# Patient Record
Sex: Female | Born: 1990 | Race: White | Hispanic: No | Marital: Married | Smoking: Never smoker
Health system: Southern US, Community
[De-identification: ages and names within clinical notes are randomized; demographics above are authoritative.]

## PROBLEM LIST (undated history)

## (undated) DIAGNOSIS — Z789 Other specified health status: Secondary | ICD-10-CM

## (undated) HISTORY — DX: Other specified health status: Z78.9

## (undated) HISTORY — PX: NO PAST SURGERIES: SHX2092

---

## 2019-12-15 NOTE — L&D Delivery Note (Signed)
Delivery Note At 5:07 PM a viable female was delivered via Vaginal, Spontaneous (Presentation:   Occiput Anterior).  APGAR: 9, 9; weight pending.   Placenta status: Spontaneous, Intact. 3V Cord with the following complications:  none.  Cord pH: n/a  Anesthesia: Local Episiotomy: None Lacerations: None Suture Repair: n/a Est. Blood Loss (mL): 250  Mom to postpartum.  Baby to Couplet care / Skin to Skin.  Mitchel Honour 10/24/2020, 5:41 PM

## 2020-03-22 LAB — OB RESULTS CONSOLE HEPATITIS B SURFACE ANTIGEN: Hepatitis B Surface Ag: NEGATIVE

## 2020-03-22 LAB — OB RESULTS CONSOLE HIV ANTIBODY (ROUTINE TESTING): HIV: NONREACTIVE

## 2020-03-22 LAB — OB RESULTS CONSOLE RUBELLA ANTIBODY, IGM: Rubella: IMMUNE

## 2020-03-22 LAB — OB RESULTS CONSOLE GC/CHLAMYDIA
Chlamydia: NEGATIVE
Gonorrhea: NEGATIVE

## 2020-03-27 ENCOUNTER — Inpatient Hospital Stay (HOSPITAL_COMMUNITY)
Admission: AD | Admit: 2020-03-27 | Payer: BC Managed Care – PPO | Source: Home / Self Care | Admitting: Obstetrics and Gynecology

## 2020-07-25 LAB — OB RESULTS CONSOLE HIV ANTIBODY (ROUTINE TESTING): HIV: NONREACTIVE

## 2020-10-16 ENCOUNTER — Telehealth (HOSPITAL_COMMUNITY): Payer: Self-pay | Admitting: *Deleted

## 2020-10-16 ENCOUNTER — Encounter (HOSPITAL_COMMUNITY): Payer: Self-pay | Admitting: *Deleted

## 2020-10-16 NOTE — Telephone Encounter (Signed)
Preadmission screen  

## 2020-10-18 ENCOUNTER — Telehealth (HOSPITAL_COMMUNITY): Payer: Self-pay | Admitting: *Deleted

## 2020-10-18 NOTE — Telephone Encounter (Signed)
Preadmission screen  

## 2020-10-21 ENCOUNTER — Encounter (HOSPITAL_COMMUNITY): Payer: Self-pay | Admitting: *Deleted

## 2020-10-21 ENCOUNTER — Telehealth (HOSPITAL_COMMUNITY): Payer: Self-pay | Admitting: *Deleted

## 2020-10-21 NOTE — Telephone Encounter (Signed)
Preadmission screen  

## 2020-10-22 ENCOUNTER — Other Ambulatory Visit (HOSPITAL_COMMUNITY)
Admission: RE | Admit: 2020-10-22 | Discharge: 2020-10-22 | Disposition: A | Discharge status: Home / Self Care | Payer: BC Managed Care – PPO | Source: Ambulatory Visit | Attending: Obstetrics & Gynecology | Admitting: Obstetrics & Gynecology

## 2020-10-22 DIAGNOSIS — Z20822 Contact with and (suspected) exposure to covid-19: Secondary | ICD-10-CM | POA: Insufficient documentation

## 2020-10-22 LAB — SARS CORONAVIRUS 2 (TAT 6-24 HRS): SARS Coronavirus 2: NEGATIVE

## 2020-10-24 ENCOUNTER — Inpatient Hospital Stay (HOSPITAL_COMMUNITY): Payer: BC Managed Care – PPO

## 2020-10-24 ENCOUNTER — Encounter (HOSPITAL_COMMUNITY): Payer: Self-pay | Admitting: Obstetrics & Gynecology

## 2020-10-24 ENCOUNTER — Inpatient Hospital Stay (HOSPITAL_COMMUNITY)
Admission: AD | Admit: 2020-10-24 | Discharge: 2020-10-26 | DRG: 806 | Disposition: A | Discharge status: Home / Self Care | Payer: BC Managed Care – PPO | Attending: Obstetrics & Gynecology | Admitting: Obstetrics & Gynecology

## 2020-10-24 DIAGNOSIS — Z20822 Contact with and (suspected) exposure to covid-19: Secondary | ICD-10-CM | POA: Diagnosis present

## 2020-10-24 DIAGNOSIS — Z3490 Encounter for supervision of normal pregnancy, unspecified, unspecified trimester: Secondary | ICD-10-CM

## 2020-10-24 DIAGNOSIS — D62 Acute posthemorrhagic anemia: Secondary | ICD-10-CM | POA: Diagnosis not present

## 2020-10-24 DIAGNOSIS — Z3A39 39 weeks gestation of pregnancy: Secondary | ICD-10-CM

## 2020-10-24 DIAGNOSIS — O9081 Anemia of the puerperium: Principal | ICD-10-CM | POA: Diagnosis not present

## 2020-10-24 DIAGNOSIS — R58 Hemorrhage, not elsewhere classified: Secondary | ICD-10-CM

## 2020-10-24 DIAGNOSIS — O26893 Other specified pregnancy related conditions, third trimester: Secondary | ICD-10-CM | POA: Diagnosis present

## 2020-10-24 LAB — CBC
HCT: 29.9 % — ABNORMAL LOW (ref 36.0–46.0)
Hemoglobin: 9.3 g/dL — ABNORMAL LOW (ref 12.0–15.0)
MCH: 24.8 pg — ABNORMAL LOW (ref 26.0–34.0)
MCHC: 31.1 g/dL (ref 30.0–36.0)
MCV: 79.7 fL — ABNORMAL LOW (ref 80.0–100.0)
Platelets: 206 10*3/uL (ref 150–400)
RBC: 3.75 MIL/uL — ABNORMAL LOW (ref 3.87–5.11)
RDW: 13 % (ref 11.5–15.5)
WBC: 9.6 10*3/uL (ref 4.0–10.5)
nRBC: 0 % (ref 0.0–0.2)

## 2020-10-24 MED ORDER — LACTATED RINGERS IV SOLN: INTRAVENOUS | Status: DC

## 2020-10-24 MED ORDER — COCONUT OIL OIL: 1.0000 "application " | TOPICAL_OIL | Status: DC | PRN

## 2020-10-24 MED ORDER — BENZOCAINE-MENTHOL 20-0.5 % EX AERO: 1.0000 "application " | INHALATION_SPRAY | CUTANEOUS | Status: DC | PRN

## 2020-10-24 MED ORDER — TETANUS-DIPHTH-ACELL PERTUSSIS 5-2.5-18.5 LF-MCG/0.5 IM SUSY: 0.5000 mL | PREFILLED_SYRINGE | Freq: Once | INTRAMUSCULAR | Status: DC

## 2020-10-24 MED ORDER — OXYTOCIN BOLUS FROM INFUSION
333.0000 mL | Freq: Once | INTRAVENOUS | Status: AC
  Administered 2020-10-24: 333 mL via INTRAVENOUS

## 2020-10-24 MED ORDER — LACTATED RINGERS IV BOLUS
500.0000 mL | Freq: Once | INTRAVENOUS | Status: AC
  Administered 2020-10-25: 500 mL via INTRAVENOUS

## 2020-10-24 MED ORDER — ONDANSETRON HCL 4 MG PO TABS: 4.0000 mg | ORAL_TABLET | ORAL | Status: DC | PRN

## 2020-10-24 MED ORDER — ONDANSETRON HCL 4 MG/2ML IJ SOLN: 4.0000 mg | Freq: Four times a day (QID) | INTRAMUSCULAR | Status: DC | PRN

## 2020-10-24 MED ORDER — METHYLERGONOVINE MALEATE 0.2 MG/ML IJ SOLN
0.2000 mg | INTRAMUSCULAR | Status: AC
  Administered 2020-10-24: 0.2 mg via INTRAMUSCULAR
  Filled 2020-10-24: qty 1

## 2020-10-24 MED ORDER — IBUPROFEN 600 MG PO TABS
600.0000 mg | ORAL_TABLET | Freq: Four times a day (QID) | ORAL | Status: DC
  Administered 2020-10-24 – 2020-10-26 (×6): 600 mg via ORAL
  Filled 2020-10-24 (×7): qty 1

## 2020-10-24 MED ORDER — LACTATED RINGERS IV SOLN: 500.0000 mL | INTRAVENOUS | Status: DC | PRN

## 2020-10-24 MED ORDER — DIPHENHYDRAMINE HCL 25 MG PO CAPS: 25.0000 mg | ORAL_CAPSULE | Freq: Four times a day (QID) | ORAL | Status: DC | PRN

## 2020-10-24 MED ORDER — ONDANSETRON HCL 4 MG/2ML IJ SOLN: 4.0000 mg | INTRAMUSCULAR | Status: DC | PRN

## 2020-10-24 MED ORDER — ACETAMINOPHEN 325 MG PO TABS
650.0000 mg | ORAL_TABLET | ORAL | Status: DC | PRN
  Administered 2020-10-25 – 2020-10-26 (×2): 650 mg via ORAL
  Filled 2020-10-24: qty 2

## 2020-10-24 MED ORDER — OXYTOCIN-SODIUM CHLORIDE 30-0.9 UT/500ML-% IV SOLN
1.0000 m[IU]/min | INTRAVENOUS | Status: DC
  Administered 2020-10-24: 2 m[IU]/min via INTRAVENOUS

## 2020-10-24 MED ORDER — TERBUTALINE SULFATE 1 MG/ML IJ SOLN: 0.2500 mg | Freq: Once | INTRAMUSCULAR | Status: DC | PRN

## 2020-10-24 MED ORDER — OXYCODONE-ACETAMINOPHEN 5-325 MG PO TABS: 2.0000 | ORAL_TABLET | ORAL | Status: DC | PRN

## 2020-10-24 MED ORDER — LIDOCAINE HCL (PF) 1 % IJ SOLN
30.0000 mL | INTRAMUSCULAR | Status: AC | PRN
  Administered 2020-10-24: 30 mL via SUBCUTANEOUS
  Filled 2020-10-24: qty 30

## 2020-10-24 MED ORDER — SENNOSIDES-DOCUSATE SODIUM 8.6-50 MG PO TABS
2.0000 | ORAL_TABLET | ORAL | Status: DC
  Administered 2020-10-25: 2 via ORAL
  Filled 2020-10-24 (×2): qty 2

## 2020-10-24 MED ORDER — ACETAMINOPHEN 325 MG PO TABS: 650.0000 mg | ORAL_TABLET | ORAL | Status: DC | PRN

## 2020-10-24 MED ORDER — OXYCODONE-ACETAMINOPHEN 5-325 MG PO TABS: 1.0000 | ORAL_TABLET | ORAL | Status: DC | PRN

## 2020-10-24 MED ORDER — DIBUCAINE (PERIANAL) 1 % EX OINT: 1.0000 "application " | TOPICAL_OINTMENT | CUTANEOUS | Status: DC | PRN

## 2020-10-24 MED ORDER — SIMETHICONE 80 MG PO CHEW: 80.0000 mg | CHEWABLE_TABLET | ORAL | Status: DC | PRN

## 2020-10-24 MED ORDER — OXYCODONE-ACETAMINOPHEN 5-325 MG PO TABS
2.0000 | ORAL_TABLET | ORAL | Status: DC | PRN
  Administered 2020-10-25: 2 via ORAL
  Filled 2020-10-24: qty 2

## 2020-10-24 MED ORDER — MISOPROSTOL 25 MCG QUARTER TABLET: 25.0000 ug | ORAL_TABLET | ORAL | Status: DC | PRN

## 2020-10-24 MED ORDER — OXYTOCIN-SODIUM CHLORIDE 30-0.9 UT/500ML-% IV SOLN
2.5000 [IU]/h | INTRAVENOUS | Status: DC
  Filled 2020-10-24: qty 500

## 2020-10-24 MED ORDER — PRENATAL MULTIVITAMIN CH
1.0000 | ORAL_TABLET | Freq: Every day | ORAL | Status: DC
  Administered 2020-10-25: 1 via ORAL
  Filled 2020-10-24 (×2): qty 1

## 2020-10-24 MED ORDER — FENTANYL CITRATE (PF) 100 MCG/2ML IJ SOLN: 50.0000 ug | INTRAMUSCULAR | Status: DC | PRN

## 2020-10-24 MED ORDER — SOD CITRATE-CITRIC ACID 500-334 MG/5ML PO SOLN: 30.0000 mL | ORAL | Status: DC | PRN

## 2020-10-24 MED ORDER — ZOLPIDEM TARTRATE 5 MG PO TABS: 5.0000 mg | ORAL_TABLET | Freq: Every evening | ORAL | Status: DC | PRN

## 2020-10-24 MED ORDER — TRANEXAMIC ACID-NACL 1000-0.7 MG/100ML-% IV SOLN
1000.0000 mg | Freq: Once | INTRAVENOUS | Status: AC
  Administered 2020-10-24: 1000 mg via INTRAVENOUS
  Filled 2020-10-24: qty 100

## 2020-10-24 MED ORDER — WITCH HAZEL-GLYCERIN EX PADS: 1.0000 "application " | MEDICATED_PAD | CUTANEOUS | Status: DC | PRN

## 2020-10-24 NOTE — Progress Notes (Signed)
On admission pt had moderate bleeding with small clots in pad. Pt had slight trickling with fundal massage. Dr. Langston Masker notified by Benedict Needy L&D RN and requested at bed side. Dr. Langston Masker ordered methergine 0.2mg  IM and came to pt bed side. Pt assessed by Dr. Langston Masker. Methergine 0.2 mg IM given per order. Pt stable, vital signs within normal limits. Pt bleeding now small, no clots. Fundus firm, one below.  No new orders given. Will continue to monitor pt.

## 2020-10-24 NOTE — Progress Notes (Signed)
This RN entered room to do pts one hour check. Pt. In bathroom, voiding. Plum sized clot on pts pad in addition to a moderate amount of bleeding. Assisted pt to bed and performed fundal massage. Trickles of blood during and after massage noted. No additional clots. Notified MD whom is en route to bedside.

## 2020-10-24 NOTE — Progress Notes (Signed)
I was called earlier this evening immediately after patient's arrival to the floor for increased bleeding and clots; measured to be approximately 250 cc.  Patient received IM methergine.  Excellent uterine tone and no further bleeding on my exam.  I was called again about 20 minutes ago and informed that when patient was up to the BR, she passed plum sized clot.  On exam, fundal massage evacuated another clot (total of 250 from this episode, 500cc since arrival to floor).  Vital signs are stable and uterus is firm 2 below umbilicus.  Will order TXA and continue to closely monitor.  Mitchel Honour, DO

## 2020-10-24 NOTE — H&P (Signed)
Evetta Semple is a 29 y.o. female G2P1001 at [redacted]w[redacted]d presenting for elective IOL.  Antepartum course uncomplicated.  Patient has h/o LEEP.  GBS negative.  OB History    Gravida  1   Para      Term      Preterm      AB      Living        SAB      TAB      Ectopic      Multiple      Live Births             Past Medical History:  Diagnosis Date  . Medical history non-contributory        Maternal Diabetes: No Genetic Screening: Normal Maternal Ultrasounds/Referrals: Normal Fetal Ultrasounds or other Referrals:  None Maternal Substance Abuse:  No Significant Maternal Medications:  None Significant Maternal Lab Results:  Group B Strep negative Other Comments:  None  Review of Systems Maternal Medical History:  Fetal activity: Perceived fetal activity is normal.   Last perceived fetal movement was within the past hour.    Prenatal complications: no prenatal complications Prenatal Complications - Diabetes: none.      There were no vitals taken for this visit. Maternal Exam:  Uterine Assessment: Contraction strength is mild.  Contraction frequency is rare.   Abdomen: Patient reports no abdominal tenderness. Fundal height is c/w dates.   Estimated fetal weight is 7#8.   Fetal presentation: vertex  Introitus: Normal vulva. Pelvis: adequate for delivery.   Cervix: Cervix evaluated by digital exam.     Fetal Exam Fetal Monitor Review: Baseline rate: 150.  Variability: moderate (6-25 bpm).   Pattern: accelerations present and no decelerations.    Fetal State Assessment: Category I - tracings are normal.     Physical Exam Constitutional:      Appearance: Normal appearance.  HENT:     Head: Normocephalic and atraumatic.  Pulmonary:     Effort: Pulmonary effort is normal.  Abdominal:     Palpations: Abdomen is soft.  Genitourinary:    General: Normal vulva.  Musculoskeletal:        General: Normal range of motion.     Cervical back: Normal range  of motion.  Skin:    General: Skin is warm and dry.  Neurological:     Mental Status: She is alert and oriented to person, place, and time.  Psychiatric:        Mood and Affect: Mood normal.        Behavior: Behavior normal.     Prenatal labs: ABO, Rh:  A positive Antibody:  Negative Rubella:  Immune RPR:   NR HBsAg:   Negative HIV:   NR GBS:   Negative  Assessment/Plan: 29yo G2P1001 at [redacted]w[redacted]d for elective IOL -Start pitocin -AROM when able -CLEA if desired -Anticipate NSVD   Mitchel Honour 10/24/2020, 1:28 PM

## 2020-10-25 LAB — PREPARE RBC (CROSSMATCH)

## 2020-10-25 LAB — CBC
HCT: 29.1 % — ABNORMAL LOW (ref 36.0–46.0)
Hemoglobin: 9.5 g/dL — ABNORMAL LOW (ref 12.0–15.0)
MCH: 27.5 pg (ref 26.0–34.0)
MCHC: 32.6 g/dL (ref 30.0–36.0)
MCV: 84.1 fL (ref 80.0–100.0)
Platelets: 170 10*3/uL (ref 150–400)
RBC: 3.46 MIL/uL — ABNORMAL LOW (ref 3.87–5.11)
RDW: 15 % (ref 11.5–15.5)
WBC: 14.5 10*3/uL — ABNORMAL HIGH (ref 4.0–10.5)
nRBC: 0 % (ref 0.0–0.2)

## 2020-10-25 LAB — RPR: RPR Ser Ql: NONREACTIVE

## 2020-10-25 LAB — ABO/RH: ABO/RH(D): A POS

## 2020-10-25 MED ORDER — ACETAMINOPHEN 325 MG PO TABS
650.0000 mg | ORAL_TABLET | Freq: Once | ORAL | Status: DC
  Filled 2020-10-25: qty 2

## 2020-10-25 MED ORDER — MISOPROSTOL 200 MCG PO TABS
ORAL_TABLET | ORAL | Status: AC
  Filled 2020-10-25: qty 5

## 2020-10-25 MED ORDER — SODIUM CHLORIDE 0.9% IV SOLUTION: Freq: Once | INTRAVENOUS | Status: AC

## 2020-10-25 MED ORDER — DIPHENHYDRAMINE HCL 25 MG PO CAPS
25.0000 mg | ORAL_CAPSULE | Freq: Once | ORAL | Status: AC
  Administered 2020-10-25: 25 mg via ORAL
  Filled 2020-10-25: qty 1

## 2020-10-25 MED ORDER — CARBOPROST TROMETHAMINE 250 MCG/ML IM SOLN
INTRAMUSCULAR | Status: AC
  Filled 2020-10-25: qty 1

## 2020-10-25 MED ORDER — DIPHENOXYLATE-ATROPINE 2.5-0.025 MG PO TABS
1.0000 | ORAL_TABLET | Freq: Once | ORAL | Status: AC
  Administered 2020-10-25: 1 via ORAL
  Filled 2020-10-25: qty 1

## 2020-10-25 NOTE — Progress Notes (Signed)
Patient reports feeling improved after 1u PRBCs in.  She still reports fatigue.  Patient is sitting up and nursing on my assessment.   Vital signs remain stable.  Fundus continues to be firm and moderate amount of blood on peri-pad; no blood/clots expressed on massage. Vaginal bleeding has improved but if worsens, would consider repeating u/s per radiologist's read:  IMPRESSION: 1. Enlarged uterus consistent with recent delivery. 2. Significant endometrial thickening without increased vascularity. Findings are nonspecific and could indicate endometrial blood products although hypovascular retained products of conception cannot be fully excluded. Consider short-term follow-up pelvic Ultrasound.  Plan to continue close monitoring.  Hemoglobin ordered 4 h post-transfusion.  Mitchel Honour, DO

## 2020-10-25 NOTE — Progress Notes (Signed)
PPD # 1  NSVD PPH - requiring meds and 2 units PRBCs  S:  Second unit of blood transfusion just completed.  Feeling much better. Got up to go to bathroom and minimal clotting noted.  O:  BP 101/64    Pulse 61    Temp 98.4 F (36.9 C) (Oral)    Resp 16    Ht 5\' 5"  (1.651 m)    Wt 72.3 kg    SpO2 98%    Breastfeeding Unknown    BMI 26.53 kg/m  Results for orders placed or performed during the hospital encounter of 10/24/20 (from the past 24 hour(s))  CBC     Status: Abnormal   Collection Time: 10/24/20 12:55 PM  Result Value Ref Range   WBC 9.6 4.0 - 10.5 K/uL   RBC 3.75 (L) 3.87 - 5.11 MIL/uL   Hemoglobin 9.3 (L) 12.0 - 15.0 g/dL   HCT 13/11/21 (L) 36 - 46 %   MCV 79.7 (L) 80.0 - 100.0 fL   MCH 24.8 (L) 26.0 - 34.0 pg   MCHC 31.1 30.0 - 36.0 g/dL   RDW 28.4 13.2 - 44.0 %   Platelets 206 150 - 400 K/uL   nRBC 0.0 0.0 - 0.2 %  Type and screen [facility]     Status: None (Preliminary result)   Collection Time: 10/24/20 12:55 PM  Result Value Ref Range   ABO/RH(D) A POS    Antibody Screen NEG    Sample Expiration 10/27/2020,2359    Unit Number 10/29/2020    Blood Component Type RED CELLS,LR    Unit division 00    Status of Unit ISSUED    Transfusion Status OK TO TRANSFUSE    Crossmatch Result      Compatible Performed at [facility], [address], [place], [place]    Unit Number [id number]    Blood Component Type RED CELLS,LR    Unit division 00    Status of Unit ISSUED    Transfusion Status OK TO TRANSFUSE    Crossmatch Result Compatible   Prepare RBC (crossmatch)     Status: None   Collection Time: 10/25/20  1:37 AM  Result Value Ref Range   Order Confirmation      ORDER PROCESSED BY BLOOD BANK Performed at [facility], [address], [place], [place]   ABO/Rh     Status: None   Collection Time: 10/25/20  1:53 AM  Result Value Ref Range   ABO/RH(D)      A POS Performed at [facility], [address] [place], [place]    Abdomen is soft and non tender Lochia WNL  IMPRESSION: PPD # 1  NSVD  PLAN: Doing well Repeat CBC at 1230 today  Monitor vaginal bleeding

## 2020-10-25 NOTE — Lactation Note (Signed)
This note was copied from a baby's chart. Lactation Consultation Note  Patient Name: Misty Neal'P Date: 10/25/2020  P2, 25 hour term female infant. LC entered room, mom was hold sleeping infant in her arms, infant swaddled in clothing. Per mom, she has not been BF infant, STS today. Per mom, she has been taking infant off breast and re-latching infant when she hears a clicking nose, LC praised mom on re-latching infant when this happens. Mom feels BF is going well, she doesn't have any concerns, infant has been breastfeeding 15-20 minutes most feedings. Mom is experienced with BF, she BF her 24 year old for one month. Mom has DEBP at home. Mom understands to BF infant by cues, 8 to 12+ times within 24 hours and will start BF infant STS while in hospital. Mom knows to call RN or LC if she needs any assistance with latching infant at the breast. LC suggested mom attend [facility] BF support group after hospital discharge. Mom made aware of O/P services, breastfeeding support groups, community resources, and our phone # for post-discharge questions.    Maternal Data    Feeding    LATCH Score                   Interventions    Lactation Tools Discussed/Used     Consult Status      Danelle Earthly 10/25/2020, 6:06 PM

## 2020-10-25 NOTE — Progress Notes (Signed)
Patient continues to have passage of small clots periodically.  U/S was ordered to r/o retained POC and showed normal postpartum uterus.  1000 mcg cytotec placed and hemabate/lomotil given.  At this time, EBL since arrival to Mother/Baby 1250cc. EBL with delivery was 250cc for total blood loss 1500cc.  Patient's starting hemoglobin prior to delivery 9.3.  Given acute blood loss on chronic anemia, I recommend transfusion of 2 u PRBCs.  Patient is counseled re: risk of viral transmission and transfusion reaction.  All questions were answered and she wishes to proceed.  2nd IV to be placed.

## 2020-10-25 NOTE — Progress Notes (Addendum)
Pt bleeding and clots continue. MD called to bedside and order for pelvic u/s and 2 units PRBC's ordered. Refer to flowsheets.

## 2020-10-26 LAB — BPAM RBC
Blood Product Expiration Date: 202111272359
Blood Product Expiration Date: 202112032359
ISSUE DATE / TIME: 202111120205
ISSUE DATE / TIME: 202111120205
Unit Type and Rh: 6200
Unit Type and Rh: 6200

## 2020-10-26 LAB — TYPE AND SCREEN
ABO/RH(D): A POS
Antibody Screen: NEGATIVE
Unit division: 0
Unit division: 0

## 2020-10-26 MED ORDER — FE FUMARATE-B12-VIT C-FA-IFC PO CAPS
1.0000 | ORAL_CAPSULE | Freq: Two times a day (BID) | ORAL | Status: DC
  Administered 2020-10-26: 1 via ORAL
  Filled 2020-10-26 (×2): qty 1

## 2020-10-26 NOTE — Discharge Summary (Signed)
Postpartum Discharge Summary  Date of Service October 26, 2020     Patient Name: Misty Neal DOB: 06/16/1991 MRN: [id number]  Date of admission: 10/24/2020 Delivery date:10/24/2020  Delivering provider: Linda Hedges  Date of discharge: 10/26/2020  Admitting diagnosis: Pregnancy [Z34.90] Intrauterine pregnancy: [redacted]w[redacted]d    Secondary diagnosis:  Active Problems:   Pregnancy  Additional problems: [place]  Discharge diagnosis: Term Pregnancy Delivered                                              Post partum procedures:blood transfusion Augmentation: AROM, Pitocin and Cytotec Complications: [id number] Hospital course: Induction of Labor With Vaginal Delivery   29y.o. yo G2P1001 at 318w3das admitted to the hospital 10/24/2020 for induction of labor.  Indication for induction: Favorable cervix at term.  Patient had an uncomplicated labor course as follows: Membrane Rupture Time/Date: 1:35 PM ,10/24/2020   Delivery Method:Vaginal, Spontaneous  Episiotomy: None  Lacerations:  None  Details of delivery can be found in separate delivery note.  Patient had a routine postpartum course. Patient is discharged home 10/26/20.  Newborn Data: Birth date:10/24/2020  Birth time:5:07 PM  Gender:Female  Living status:Living  Apgars:9 ,9  Weight:3430 g   Magnesium Sulfate received: No BMZ received: No Rhophylac:No MMR:No T-DaP:Given prenatally Flu: Yes Transfusion:Yes  Physical exam  Vitals:   10/25/20 0835 10/25/20 1630 10/25/20 2151 10/26/20 0530  BP: 101/64 98/73 94/60  98/62  Pulse: 61 70 67 65  Resp: 16 18 18 18   Temp: 98.4 F (36.9 C) 98 F (36.7 C) 98 F (36.7 C) 98 F (36.7 C)  TempSrc: Oral Oral Oral Oral  SpO2: 98%  100% 100%  Weight:      Height:       General: alert Lochia: appropriate Uterine Fundus: firm Incision: Healing well with no significant drainage DVT Evaluation: No evidence of DVT seen on physical exam. Labs: Lab Results  Component  Value Date   WBC 14.5 (H) 10/25/2020   HGB 9.5 (L) 10/25/2020   HCT 29.1 (L) 10/25/2020   MCV 84.1 10/25/2020   PLT 170 10/25/2020   No flowsheet data found. Edinburgh Score: Edinburgh Postnatal Depression Scale Screening Tool 10/25/2020  I have been able to laugh and see the funny side of things. 0  I have looked forward with enjoyment to things. 0  I have blamed myself unnecessarily when things went wrong. 1  I have been anxious or worried for no good reason. 2  I have felt scared or panicky for no good reason. 1  Things have been getting on top of me. 0  I have been so unhappy that I have had difficulty sleeping. 0  I have felt sad or miserable. 1  I have been so unhappy that I have been crying. 0  The thought of harming myself has occurred to me. 0  Edinburgh Postnatal Depression Scale Total 5      After visit meds:  Allergies as of 10/26/2020   No Known Allergies     Medication List    You have not been prescribed any medications.      Discharge home in stable condition Infant Feeding: Breast Infant Disposition:home with mother Discharge instruction: per After Visit Summary and Postpartum booklet. Activity: Advance as tolerated. Pelvic rest for 6 weeks.  Diet: routine diet Anticipated Birth Control: Unsure  Postpartum Appointment:6 weeks Additional Postpartum F/U: none Future Appointments:No future appointments. Follow up Visit:      10/26/2020 Cyril Mourning, MD

## 2020-10-26 NOTE — Progress Notes (Signed)
AVS printed and discharged instructions given to patient. Patient instructed to call for follow-up appointment. All questions answered and pt verbalized understanding.  

## 2020-10-26 NOTE — Lactation Note (Signed)
This note was copied from a baby's chart. Lactation Consultation Note  Patient Name: Girl Jamelah Sitzer IRCVE'L Date: 10/26/2020 Reason for consult: Follow-up assessment;Infant weight loss;Term  Baby is 56 hours old, 6 % weight loss, 35 hours old - Bili check - 7.4  Per mom the baby last fed at 1130 for 15 mins.  LC reviewed the doc flow sheets and updated per mom.  Mom denies soreness. Sore nipple and engorgement prevention and tx reviewed.  Per  Mom has a pump at home.  LC recommended STS feedings until the baby is back to birth weight, gaining steadily and can stay awake for a majority of the feeding.  Mom has the [facility] pamphlet with resources.     Maternal Data    Feeding Feeding Type: Breast Fed  LATCH Score                   Interventions Interventions: Breast feeding basics reviewed  Lactation Tools Discussed/Used     Consult Status Consult Status: Complete Date: 10/26/20    Kathrin Greathouse 10/26/2020, 12:05 PM

## 2021-01-28 IMAGING — US US PELVIS COMPLETE
1 series · 15 of 25 positions shown · non-contrast
Comparison: None.

CLINICAL DATA: Heavy postpartum bleeding

EXAM:
TRANSABDOMINAL ULTRASOUND OF PELVIS
TECHNIQUE: Transabdominal ultrasound examination of the pelvis was performed
including evaluation of the uterus, ovaries, adnexal regions, and
pelvic cul-de-sac.

[Series 1: us pelvis complete · 15 of 52 slices shown]
[im 1/52]
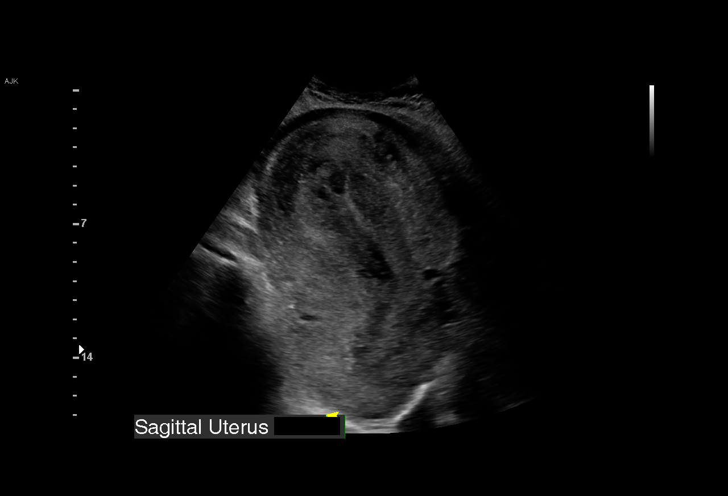
[im 5/52]
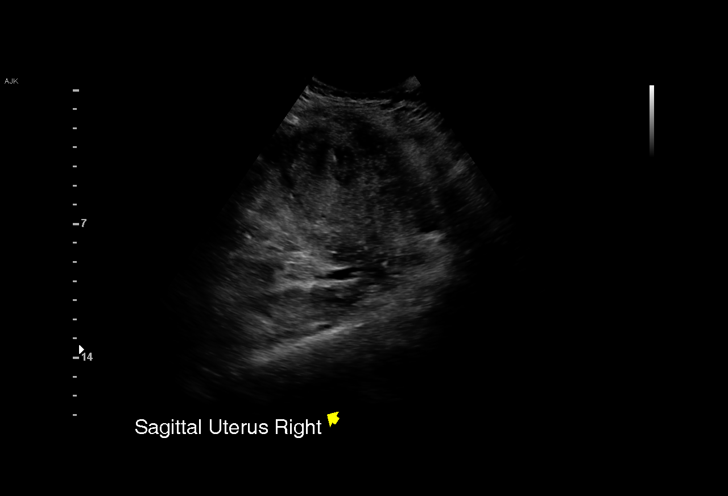
[im 9/52]
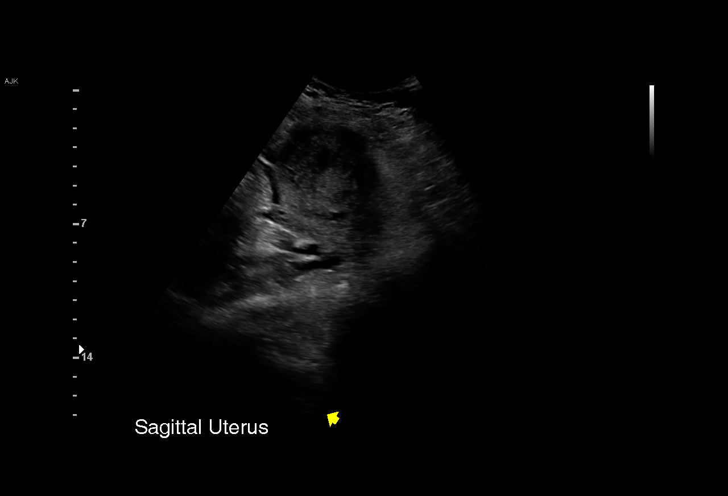
[im 11/52]
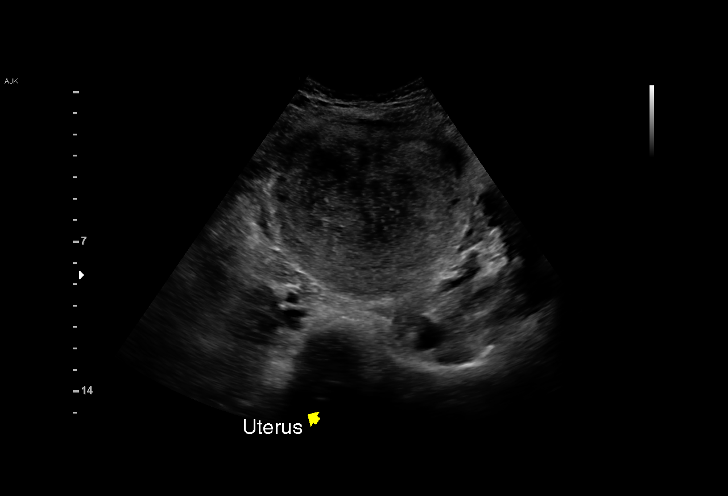
[im 15/52]
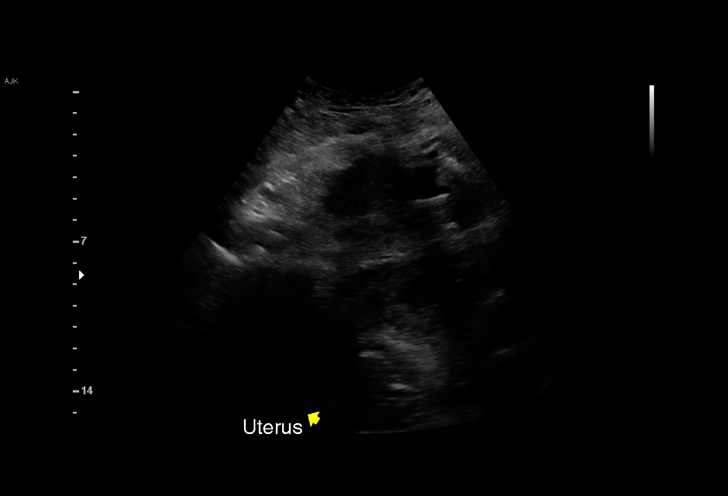
[im 20/52]
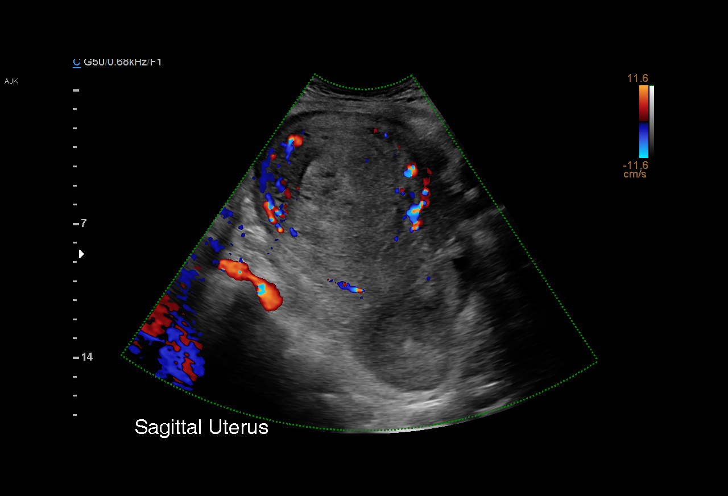
[im 22/52]
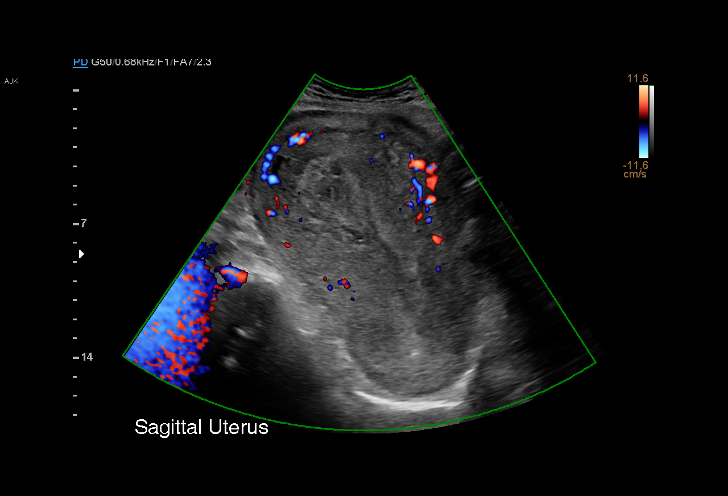
[im 26/52]
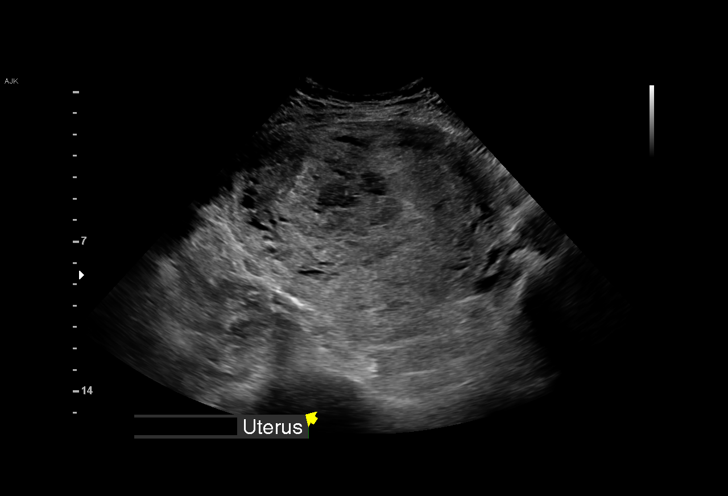
[im 30/52]
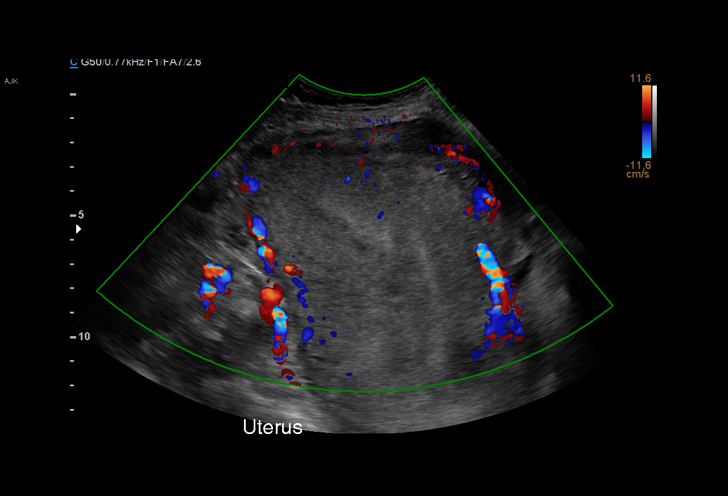
[im 32/52]
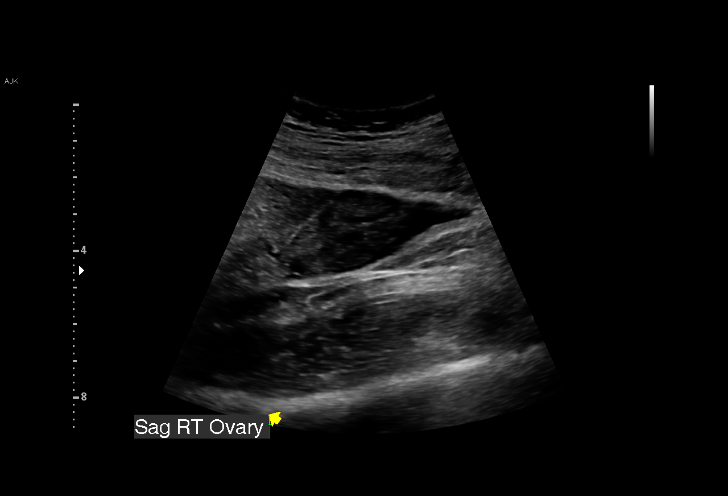
[im 37/52]
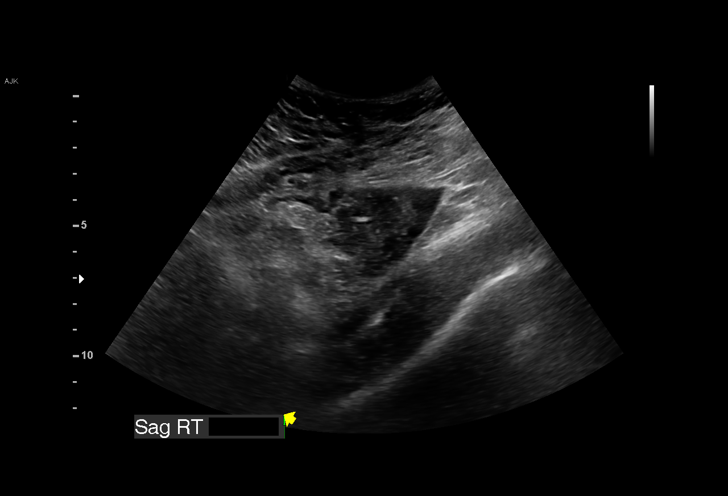
[im 41/52]
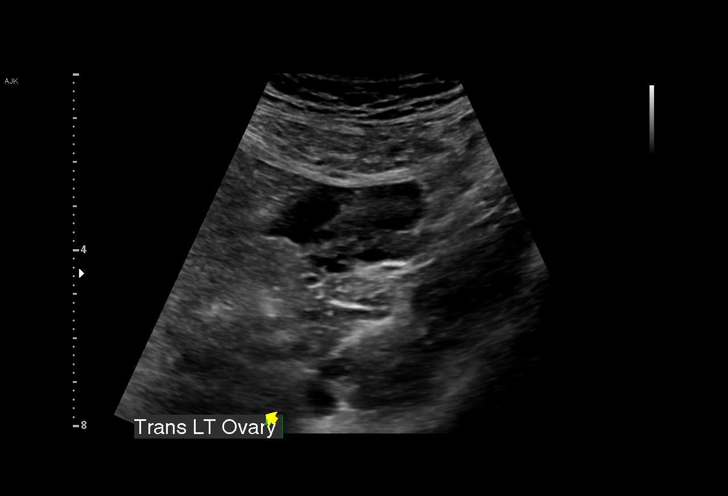
[im 43/52]
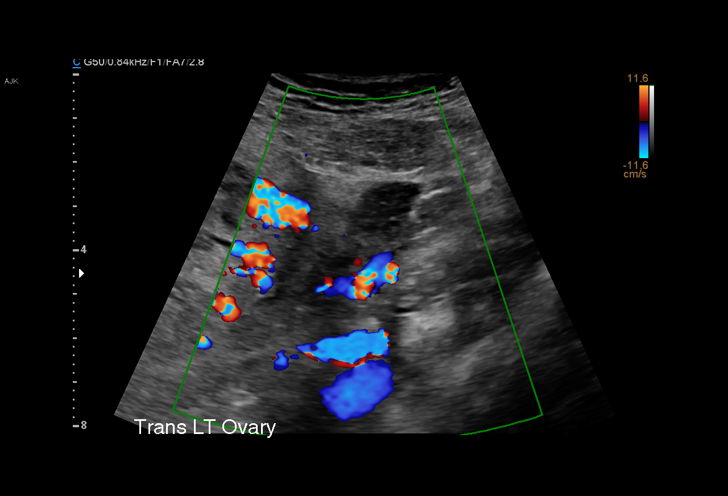
[im 47/52]
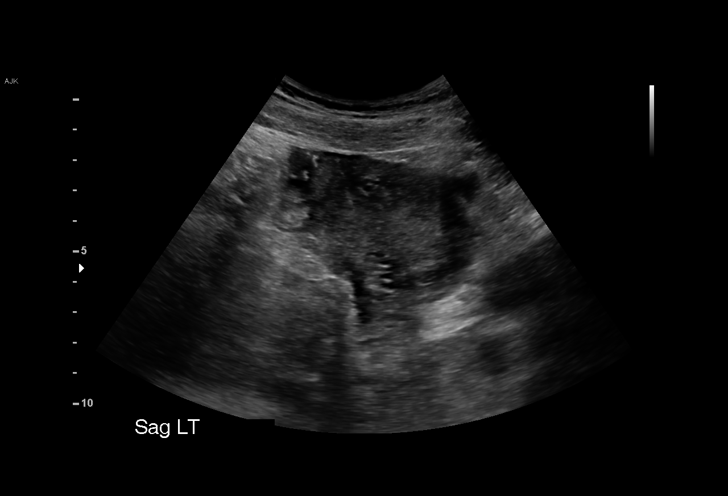
[im 52/52]
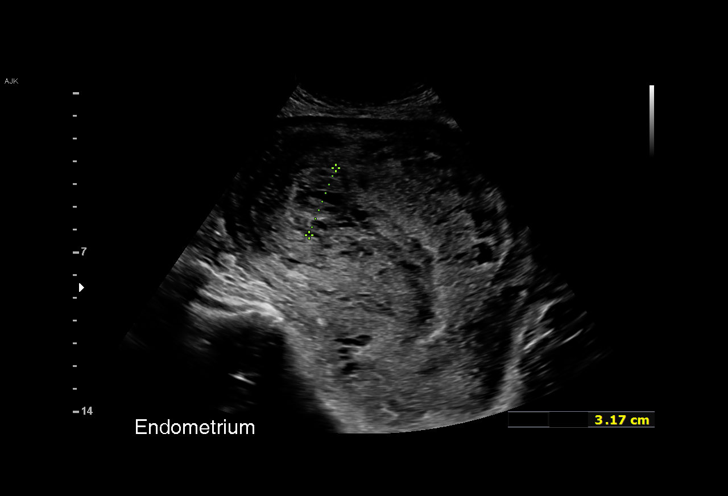

[15 of 25 positions shown; findings below may reference images not displayed]

FINDINGS: Uterus

Measurements: 16.2 x 10.8 x 12.6 cm = volume: 1,153 mL. The uterus
is heterogeneous and enlarged consistent with recent delivery. There
is no discrete mass.

Endometrium

Thickness: 3.2 cm. There is no focal lesion within the endometrium.
There is no significant hypervascularity.

Right ovary

Measurements: 4 x 2 x 3.2 cm = volume: 13.5 mL. Normal appearance/no
adnexal mass.

Left ovary

Measurements: 3.7 x 2 x 2.1 cm = volume: 8.2 mL. Normal
appearance/no adnexal mass.

Other findings:  No abnormal free fluid.
IMPRESSION: 1. Enlarged uterus consistent with recent delivery.
2. Significant endometrial thickening without increased vascularity.
Findings are nonspecific and could indicate endometrial blood
products although hypovascular retained products of conception
cannot be fully excluded. Consider short-term follow-up pelvic
ultrasound.

## 2023-01-13 ENCOUNTER — Telehealth: Payer: Self-pay | Admitting: Licensed Clinical Social Worker

## 2023-01-13 NOTE — Telephone Encounter (Signed)
Scheduled appt per 1/30 referral. Pt is aware of appt date and time. Pt is aware to arrive 15 mins prior to appt time and to bring and updated insurance card. Pt is aware of appt location.   

## 2023-03-08 ENCOUNTER — Inpatient Hospital Stay: Payer: No Typology Code available for payment source

## 2023-03-08 ENCOUNTER — Inpatient Hospital Stay: Payer: No Typology Code available for payment source | Admitting: Licensed Clinical Social Worker

## 2023-03-12 ENCOUNTER — Other Ambulatory Visit: Payer: Self-pay | Admitting: General Surgery

## 2023-03-12 DIAGNOSIS — Z1501 Genetic susceptibility to malignant neoplasm of breast: Secondary | ICD-10-CM

## 2023-04-10 ENCOUNTER — Other Ambulatory Visit: Payer: No Typology Code available for payment source

## 2024-02-11 ENCOUNTER — Other Ambulatory Visit: Payer: Self-pay | Admitting: Obstetrics and Gynecology

## 2024-02-11 DIAGNOSIS — R898 Other abnormal findings in specimens from other organs, systems and tissues: Secondary | ICD-10-CM
